# Patient Record
Sex: Female | Born: 1965 | Race: White | Hispanic: No
Health system: Midwestern US, Academic
[De-identification: ages and names within clinical notes are randomized; demographics above are authoritative.]

---

## 2016-11-18 MED ORDER — CLONIDINE HCL 0.1 MG PO TAB
ORAL_TABLET | Freq: Every evening | 1 refills
Start: 2016-11-18 — End: ?

## 2016-12-16 MED ORDER — BENZTROPINE 1 MG PO TAB
ORAL_TABLET | Freq: Every evening | 1 refills | Status: DC
Start: 2016-12-16 — End: 2017-01-16

## 2016-12-16 MED ORDER — PALIPERIDONE 3 MG PO TR24
ORAL_TABLET | Freq: Every day | ORAL | 1 refills | 28.00000 days | Status: DC
Start: 2016-12-16 — End: 2017-01-16

## 2017-01-12 ENCOUNTER — Encounter: Admit: 2017-01-12 | Discharge: 2017-01-12 | Payer: MEDICARE | Primary: Hematology & Oncology

## 2017-01-12 MED ORDER — CLOZAPINE 100 MG PO TAB
ORAL_TABLET | ORAL | 1 refills | 30.00000 days | Status: AC
Start: 2017-01-12 — End: 2017-03-13

## 2017-01-16 ENCOUNTER — Ambulatory Visit: Admit: 2017-01-16 | Discharge: 2017-01-16 | Payer: MEDICARE | Primary: Hematology & Oncology

## 2017-01-16 DIAGNOSIS — F251 Schizoaffective disorder, depressive type: Principal | ICD-10-CM

## 2017-01-16 MED ORDER — BENZTROPINE 1 MG PO TAB
2 mg | ORAL_TABLET | Freq: Every evening | ORAL | 1 refills | Status: AC
Start: 2017-01-16 — End: 2017-05-07

## 2017-01-16 MED ORDER — ATROPINE 1 % OP DROP
INTRAMUSCULAR | 0 refills | 15.00000 days | Status: AC
Start: 2017-01-16 — End: 2017-05-05

## 2017-01-16 MED ORDER — PALIPERIDONE 6 MG PO TR24
6 mg | ORAL_TABLET | Freq: Every day | ORAL | 2 refills | 28.00000 days | Status: AC
Start: 2017-01-16 — End: 2017-03-18

## 2017-01-16 NOTE — Progress Notes
Subjective:       Lynn Fletcher is a 51 year old female with a history of Schizoaffective disorder, depressive type, also Stimulant/Alcohol/Opioid use disorder with sustained remission, who presents to day for scheduled follow up. Last seen on 3/14 where - Decrease Clozapine to 200mg  QAM and 300mg  QHS,  Start Invega 3mg  QAM for psychosis, Change Cogentin to 2mg  QHS from 1mg  BID to attempt better management of sialorrhea, D/c Clonidine 0.1mg  qhs for nocturnal sialorrhea. Here today with daughter and grandson.     Sialorrhea at night continues to be bothersome, pillow getting soaked. Not much improvement since last visit. If doesn't have to go to work she is mostly sleeping, feels tired through the day    Feels that she is getting more depressed. Mood is depressed, worse than last visit. Appetite is increased, weight is unchanged. Concentration continues to be problematic, memory issues are ongoing. 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 Works at least 4-5 times/week, about 4 hours at a time, and on weekends 8 hours.  Stays by herself and cleans when working, Kathlene November and Aggie Cosier keep me company.     Does describe having on off thought of not wanting to be around anymore, especially given her ongoing AH. Daughter and grandson is protective factor, if not for them would leave this world. Still with AH - unchanged, Kathlene November and Aggie Cosier, also hears voices of police officers saying leave her alone. Has thoughts of hurting herself because doesn't want to hear mike an theresa's voices anymore but denies intent or plan to act on this.     Drugs - denies  Alcohol - denies     Living - with significant other  Has a case manager through [facility] (guidance center), but considering quitting due to cost  Income - working at [employer], also gets social security             Review of Systems Constitutional: Positive for fatigue.   Musculoskeletal: Negative for gait problem.   Psychiatric/Behavioral: Positive for dysphoric mood and hallucinations.         Objective:         ??? atropine (ATROPISOL) 1 % ophthalmic solution 1-2 drops orally at night before sleep   ??? benztropine (COGENTIN) 1 mg tablet Take 2 tablets by mouth at bedtime daily.   ??? cloZAPine (CLOZARIL) 100 mg tablet TAKE 2 TABLETS BY MOUTH EVERY MORNING AND TAKE 3 TABLETS AT NIGHT   ??? CYCLOBENZAPRINE HCL (FLEXERIL PO) Take  by mouth.   ??? HYDROcodone/acetaminophen (NORCO) 5/325 mg tablet Take 1 tablet by mouth every 12 hours as needed for Pain   ??? NAPROXEN PO Take  by mouth.   ??? paliperidone ER(+) (INVEGA) 6 mg tablet Take 1 tablet by mouth daily.   ??? pregabalin (LYRICA) 50 mg capsule Take 50 mg by mouth three times daily.     Vitals:    01/16/17 0814   BP: (!) 139/97   Pulse: 95   Weight: 83 kg (183 lb)   Height: 162.6 cm (64)     Body mass index is 31.41 kg/m???.     Physical Exam   Psychiatric:   General/Constitutional: Adult caucasian female who appears stated age, NAD, dressed in casual attire, fair hygiene/grooming  Eye Contact: Fair  Behavior: Calm/cooperative, pleasant  Speech: Normal tone/volume/rate  Mood: Depressed  Affect: Euthymic, restricted by reactive, stable  Thought Process: Linear, goal oriented  Thought Content: Denies SI/HI  Perception: Denies AH at this time, denies VH  Associations: Intact  Insight: Fair  Judgement: Fair    Gait: WNL             Assessment and Plan:  IMPRESSION DIAGNOSIS: ???  AXIS I: ???Schizoaffective Disorder, Depressive type, multiple episodes  Alcohol Use Disorder, severe, in sustained remission   Stimulant Use Disorder, severe, in sustained remission (Methamphetamines, cocaine)  Opioid Use Disorder, severe, in sustained remission    AXIS II: ???Deferred  AXIS III: ???None  AXIS IV: ???PSYCHOSOCIAL STRESSORS: ???Mild   AXIS V: ???CURRENT GAF: ???60  ???  PLAN: Ongoing psychosis without any signs of improvement, now also with worsening of mood. Will titrate Invega up to maximum dosing, if not effective can consider Zyprexa as per most recent D/C summary she was able to achieve stability on this medication, unclear why it was discontinued. Moving forward can also consider starting Metformin for weight gain.     - Continue Clozapine 200mg  QAM and 300mg  QHS at this time  - On 1/18 Clozapine level: 701, NorClozapine level: 338, Total Clozapine: 1039 (Was on 200mg /400mg  at the time)  - Increase Invega to 6mg  QAM for psychosis, advised to call in one week to discuss increasing dose if tolerating  - On 1/18: Total Chol: 196, HDL: 61, LDL: 123, HbA1c 5.0%  - Continue Cogentin 2mg  QHS at this time, may consider decreasing dose if Atropine drops are effective for sialorrhea  - Strongly recommended continued abstinence from alcohol and substances  ???  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   - No acute safety issues, discussed safety plan should safety concerns arise  - Has case manager through guidance center who she sees weekly  - Strongly suggested establishing psychotherapy, voices agreement and plans to do so closer to her home  ???  - RTC in 8 weeks or PRN  ???  Patient seen and discussed with Dr. Erick Colace

## 2017-01-19 NOTE — Progress Notes
ATTENDING NOTE      Encounter Date: 01/16/2017    I saw and evaluated Lynn Fletcher, discussed with Etta Quill, MD and concur with the assessment and treatment plan.     PRESENTING PROBLEM AND BACKGROUND: Patient is 51 y.o. female with Schizoaffective disorder depressive type as well as alcohol, methamphetamines, cocaine, and opioid use disorders all in sustained remission.    CURRENT TREATMENT AND RESPONSES:   Reports ongoing psychosis which has worsened since last visit.  Also endorses passive suicidal ideations without current intention or desire.  Identifies her family as protective factors.    Continues to have sialorrhea from Clozapine.    PLAN:  1. Continue Clozapiine.  2. Increase Invega as directed.  3. Continue Cogentin.  4. Start Atropine drops for sialorrhea

## 2017-01-21 ENCOUNTER — Encounter: Admit: 2017-01-21 | Discharge: 2017-01-21 | Payer: MEDICARE | Primary: Hematology & Oncology

## 2017-01-21 NOTE — Progress Notes
Received lab work from [facility]:    On 01/14/17 CBC w/diff showing WBC 11.5, Plt 263, ANC 8.4

## 2017-03-09 ENCOUNTER — Encounter: Admit: 2017-03-09 | Discharge: 2017-03-09 | Payer: MEDICARE | Primary: Hematology & Oncology

## 2017-03-13 ENCOUNTER — Encounter: Admit: 2017-03-13 | Discharge: 2017-03-13 | Payer: MEDICARE | Primary: Hematology & Oncology

## 2017-03-13 MED ORDER — CLOZAPINE 100 MG PO TAB
ORAL_TABLET | ORAL | 2 refills | 30.00000 days | Status: AC
Start: 2017-03-13 — End: 2017-05-05

## 2017-03-13 NOTE — Telephone Encounter
Contacted Clozapine [facility] and re-enrolled patient. Called pharmacy again who verified that last [place] on 8/1 was 5.64. Pharmacy stating they are able to dispense the medication now.

## 2017-03-13 NOTE — Telephone Encounter
Kelsey from [facility] called stating they are unable to fill script for Clozapine because the patient needs to be re-enrolled in the Clozapine Registry from our end. Thank you!

## 2017-03-17 ENCOUNTER — Encounter: Admit: 2017-03-17 | Discharge: 2017-03-17 | Payer: MEDICARE | Primary: Hematology & Oncology

## 2017-03-17 NOTE — Telephone Encounter
Patient called wondering if you would be able to call her during or before her appointment.  She is having a hard time finding a ride and if she doesn't need to come, she would rather not have to have her daughter drive her here after working all evening.

## 2017-03-18 ENCOUNTER — Encounter: Admit: 2017-03-18 | Discharge: 2017-03-18 | Payer: MEDICARE | Primary: Hematology & Oncology

## 2017-03-18 MED ORDER — PALIPERIDONE 9 MG PO TR24
9 mg | ORAL_TABLET | Freq: Every day | ORAL | 1 refills | 28.00000 days | Status: AC
Start: 2017-03-18 — End: 2017-05-05

## 2017-03-18 NOTE — Telephone Encounter
Spoke with patient who states that she is having difficulty finding transportation due to her daughter's work schedule. States that she continues to feel tired and depressed, and denies having had any improvement with her AH, which continue to be bothersome. She denies SI/HI. Denies any worsening of tierdeness with increase in [place] and agreeable to increasing to 9mg  nightly.     She will call in and reschedule her appointment in about 4 weeks as she anticipates having more transportation options then.

## 2017-03-18 NOTE — Telephone Encounter
Pt called in saying she doesn't have transportation for tomorrow's appointment but can not miss it. She is wanting to know if you would be able to call her and do the appointment by phone rather than her coming in. Please let me know and I will inform Pt of whatever you decide. Thank you!!

## 2017-04-06 ENCOUNTER — Encounter: Admit: 2017-04-06 | Discharge: 2017-04-06 | Payer: MEDICARE | Primary: Hematology & Oncology

## 2017-04-07 ENCOUNTER — Encounter: Admit: 2017-04-07 | Discharge: 2017-04-07 | Payer: MEDICARE | Primary: Hematology & Oncology

## 2017-04-07 NOTE — Telephone Encounter
Dr. Joylene Igo from [facility] called stating that this patient was admitted last evening with psychosis.  Dr. Joylene Igo would like to coordinate care with you and review her medication history, etc.  Please call today.

## 2017-05-05 ENCOUNTER — Ambulatory Visit: Admit: 2017-05-05 | Discharge: 2017-05-05 | Payer: MEDICARE | Primary: Hematology & Oncology

## 2017-05-05 DIAGNOSIS — F251 Schizoaffective disorder, depressive type: Principal | ICD-10-CM

## 2017-05-05 MED ORDER — TRAZODONE 50 MG PO TAB
50 mg | ORAL_TABLET | Freq: Every evening | ORAL | 1 refills | Status: AC | PRN
Start: 2017-05-05 — End: 2017-05-22

## 2017-05-05 MED ORDER — LAMOTRIGINE 100 MG PO TAB
100 mg | ORAL_TABLET | Freq: Every evening | ORAL | 1 refills | Status: AC
Start: 2017-05-05 — End: 2017-09-22

## 2017-05-05 MED ORDER — LAMOTRIGINE 25 MG PO TAB
ORAL_TABLET | Freq: Every day | 0 refills | Status: AC
Start: 2017-05-05 — End: 2017-06-04

## 2017-05-05 MED ORDER — CLOZAPINE 100 MG PO TAB
ORAL_TABLET | Freq: Every morning | ORAL | 2 refills | 30.00000 days | Status: AC
Start: 2017-05-05 — End: 2017-06-04

## 2017-05-05 NOTE — Progress Notes
Subjective:       Lynn Fletcher is a 51 year old female with a history of Schizoaffective disorder, depressive type, also Stimulant/Alcohol/Opioid use disorder with sustained remission, who presents to day for scheduled follow up. Last seen on 6/8 where - continued Clozapine 200mg  QAM and 300mg  QHS, Invega was increased to 6mg  QAM for psychosis, Cogentin to 2mg  QHS was continued. Presents today with CM.     Hospitalized in the interim. Invega stopped. Clozapine increased to 200mg /350mg . Cogentin increase to 3mg  QHS. Added Hydroxyzine 25 BID and Trazodone 50mg  QHS.     Still finds herself drooling at night with sleep, pillow is soaked when she waked. During the day is manageable.     Went to the hospital 8/27, because voices were getting worse, drank a lot on a night, had 7-8 drinks. Feels that she drank because she wanted to relax. Voices caused her to be anxious and more depressed. Thought that that her significant other was messing around with someone else, found out a few months ago. They don't discuss it, he doesn't think she knows. Trying to save money to speak up for herself so if she needs to leave. Doesn't feel that she is good enough. Feel like a piece of shit. The girl actually came up to her and told her that they had been talking. It was after finding out about this, about 3 months ago, that she feels she began to decompensate. States that he will lie and call her crazy. States that another concerns she has is that if not living with Bruce, will not be able to have the kids to care for them. This is because family will not let her watch the kids by herself.     Mood is grouchy. States that she feels depressed most of the time. Sleeping too much, getting 10 hours nightly. Sleeping through the night, going to bed around nine, and still tired in the morning. Has to get up and be at work by 6. Forcing herself to go to work. If not at work she is sleeping, or watching grandkids. No enthusiasm to go out and do things. takes everything I got to get going, and therefore always out of energy. Appetite is at baseline, but has been losing weight, which she is happy about. Energy is low, concentration is poor, finding herself to be more forgetful.     Describes feeling depressed and anxious. States that her anxiety is driven by the voices. Hydroxyzine not helping with anxiety.    SI/HI - Swants to kill Kathlene November and Aggie Cosier. Has intermittent thoughts of not wanting to be alive anymore.   AVH: Voices will not go away until they catch them or I die. - They are there day and night. Telling her now is she telling the truth.     Drugs - dneies  Alcohol - Once, prior to that 3-4 months ago    Living - with significant other  Has a case manager through [facility] (guidance center), but considering quitting due to cost  Income - working at [employer], also gets social security           Review of Systems   Constitutional: Positive for fatigue.   Cardiovascular: Negative for chest pain.   Gastrointestinal: Negative for abdominal pain.   Psychiatric/Behavioral: Positive for decreased concentration, dysphoric mood and hallucinations.         Objective:         ??? benztropine (COGENTIN) 1 mg tablet  Take 2 tablets by mouth at bedtime daily. (Patient taking differently: Take 3 mg by mouth at bedtime daily.)   ??? cloZAPine (CLOZARIL) 100 mg tablet TAKE 2 TABLETS BY MOUTH EVERY MORNING AND TAKE 3 TABLETS AT NIGHT (Patient taking differently: TAKE 2 TABLETS BY MOUTH EVERY MORNING AND TAKE 3.5 TABLETS AT NIGHT)   ??? CYCLOBENZAPRINE HCL (FLEXERIL PO) Take 10 mg by mouth.   ??? hydrOXYzine (ATARAX) 25 mg tablet Take 25 mg by mouth three times daily as needed for Itching.   ??? NAPROXEN PO Take  by mouth.   ??? omeprazole DR(+) (PRILOSEC) 40 mg capsule Take 40 mg by mouth daily before breakfast.   ??? traZODone (DESYREL) 50 mg tablet Take 50 mg by mouth at bedtime as needed for Sleep.     Vitals:    05/05/17 1408   BP: 112/85   Pulse: 69   Weight: 76.7 kg (169 lb)   Height: 162.6 cm (64)     Body mass index is 29.01 kg/m???.     Physical Exam   Psychiatric:   General/Constitutional: Adult caucasian female who appears stated age, NAD, dressed in casual attire, fair hygiene/grooming  Eye Contact: Fair  Behavior: Calm/cooperative, pleasant  Speech: Normal tone/volume/rate  Mood: Grouchy  Affect: Euthymic, restricted by reactive, stable  Thought Process: Linear, goal oriented  Thought Content: Denies SI/HI  Perception: Denies AH at this time, denies VH  Associations: Intact  Insight: Fair  Judgement: Fair    Gait: WNL              Assessment and Plan:  IMPRESSION DIAGNOSIS: ???  AXIS I: ???Schizoaffective Disorder, Depressive type, multiple episodes  Alcohol Use Disorder, severe, in sustained remission   Stimulant Use Disorder, severe, in sustained remission (Methamphetamines, cocaine)  Opioid Use Disorder, severe, in sustained remission    AXIS II: ???Deferred, Borderline intellectual functioning (Per Neuropsych testing from 05/15/16)  AXIS III: ???None  AXIS IV: ???PSYCHOSOCIAL STRESSORS: ???Mild   AXIS V: ???CURRENT GAF: ???60  ???  PLAN:  Had recent hospitalization with worsening of psychosis/mood/anxiety in setting of stress due to finding out about her significant other's infidelity. On/off SI with no intent or plan, hallucinations continue to be bothersome. Can consider Olanzapine moving forward, but given reports of low energy being one of her biggest concerns will hold off. Recalls having done better from mood standpoint on Lamictal. Will retrial and pending response may consider possiblity of schizoaffective bipolar component.     - Start Lamotrigine 25mg  daily for 2 weeks -> then increase to 50mg  daily for 2 weeks -> then increase to 100mg  daily. Then call clinic for further instructions. Advised of risks/benefits and side effects and advised to monitor for rash - Start Trazodone 12.5mg  TID PRN for anxiety and continue Trazodone 50mg  QHS PRN sleep.     - Continue Clozapine 200mg  QAM and 350mg  QHS at this time  - On 8/28: Total Chol: 195, Trig: 116, HDL 44, LDL 128  AST 11, ALT 10, TSH 1.6, Plt 372  Total Clozapine - 453, Noclozapine - 173 (on 200mg /300mg  dosing)    - Continue Cogentin 2mg  QHS at this time  - Strongly recommended continued abstinence from alcohol and substances    - No acute safety issues, discussed safety plan should safety concerns arise  - Has case manager through guidance center who she sees weekly    - Strongly suggested establishing psychotherapy, voices agreement and plans to do so closer to her home  ???  -  RTC in 8 weeks or PRN  ???  Patient seen and discussed with Dr. Erick Colace

## 2017-05-07 ENCOUNTER — Encounter: Admit: 2017-05-07 | Discharge: 2017-05-07 | Payer: MEDICARE | Primary: Hematology & Oncology

## 2017-05-07 MED ORDER — BENZTROPINE 1 MG PO TAB
2 mg | ORAL_TABLET | Freq: Every evening | ORAL | 2 refills | Status: AC
Start: 2017-05-07 — End: 2017-06-04

## 2017-05-09 NOTE — Progress Notes
ATTENDING NOTE      Encounter Date: 05/05/2017    I saw and evaluated Lynn Fletcher, discussed with Etta Quill, MD and concur with the assessment and treatment plan.     PRESENTING PROBLEM AND BACKGROUND: Patient is 51 y.o. female with Schizoaffective disorder- depressive type, alcohol use disorder severe in remission, stimulant use disorder in remission, and opioid use disorder in remission.      CURRENT TREATMENT AND RESPONSES:   Was recently hospitalized due to worsening psychosis and mood issues.  They increased her clonazepam at that time.  However, continues to have ongoing issues with depression.  Recalls past benefit from Lamictal and would like to try it again.    PLAN:  1. Medication as per the resident note.

## 2017-05-22 ENCOUNTER — Encounter: Admit: 2017-05-22 | Discharge: 2017-05-22 | Payer: MEDICARE | Primary: Hematology & Oncology

## 2017-05-22 MED ORDER — TRAZODONE 50 MG PO TAB
150 mg | ORAL_TABLET | Freq: Every evening | ORAL | 1 refills | Status: AC | PRN
Start: 2017-05-22 — End: 2017-06-04

## 2017-05-22 NOTE — Telephone Encounter
Called pt; notified her of 10/16 appointment at 3:00 PM.  Educated pt to increase Trazodone to 150 mg at bedtime per Dr. Scheryl Darter.  Rx called to pharmacy, however told her she may have enough on current Rx at home.  Instructed pt to come to ED if symptoms persist or if she experiences SI/HI. Pt verbalized understanding, appreciative of return call.

## 2017-05-22 NOTE — Telephone Encounter
Paged Dr. Scheryl Darter.  Scheduled appointment for pt 10/16.  Returned call to pt to discuss plan of care.  Left voice message for pt to return call to this writer.

## 2017-05-22 NOTE — Telephone Encounter
Pt calls today, reports she has been manic for 4 days.  Pt reports she left a message yesterday, not received by this Probation officer.  Pt reports she's sleep, but she's getting up in the middle of the night.  Pt reports racing thoughts, feeling irritable, feeling nervous, and says " I just feel terrible."  Pt reports she has been taking meds as prescribed; when reviewing meds; pt also reports taking Benzotropine 1 mg , takes 3 tablets at bedtime.  Pt denies SI/HI; pt reports AH, calling her names.  Denies VH.  Pt speaking rapidly, states she needs help now.  Assured pt this call will be routed to Dr. Scheryl Darter or another resident provider.  Called [facility] and spoke to [facility] to clarify Lamictal Rx. Pt received bubble packs, states

## 2017-06-04 ENCOUNTER — Ambulatory Visit: Admit: 2017-06-04 | Discharge: 2017-06-04 | Payer: MEDICARE | Primary: Hematology & Oncology

## 2017-06-04 DIAGNOSIS — F251 Schizoaffective disorder, depressive type: Principal | ICD-10-CM

## 2017-06-04 DIAGNOSIS — F418 Other specified anxiety disorders: ICD-10-CM

## 2017-06-04 MED ORDER — DOCUSATE SODIUM 100 MG PO CAP
100 mg | ORAL_CAPSULE | Freq: Two times a day (BID) | ORAL | 2 refills | Status: AC
Start: 2017-06-04 — End: ?

## 2017-06-04 MED ORDER — TRAZODONE 100 MG PO TAB
ORAL_TABLET | 2 refills | Status: AC | PRN
Start: 2017-06-04 — End: 2017-09-02

## 2017-06-04 MED ORDER — BENZTROPINE 1 MG PO TAB
2 mg | ORAL_TABLET | Freq: Every evening | ORAL | 2 refills | Status: AC
Start: 2017-06-04 — End: 2017-09-02

## 2017-06-04 MED ORDER — LAMOTRIGINE 200 MG PO TAB
200 mg | ORAL_TABLET | Freq: Every evening | ORAL | 2 refills | Status: AC
Start: 2017-06-04 — End: 2017-09-22

## 2017-06-04 MED ORDER — CLOZAPINE 100 MG PO TAB
ORAL_TABLET | Freq: Every morning | ORAL | 2 refills | 30.00000 days | Status: AC
Start: 2017-06-04 — End: 2017-09-22

## 2017-06-04 MED ORDER — LAMOTRIGINE 150 MG PO TAB
150 mg | ORAL_TABLET | Freq: Every evening | ORAL | 0 refills | Status: AC
Start: 2017-06-04 — End: 2017-09-22

## 2017-06-04 MED ORDER — OLANZAPINE 2.5 MG PO TAB
2.5 mg | ORAL_TABLET | Freq: Every day | ORAL | 2 refills | 30.00000 days | Status: AC
Start: 2017-06-04 — End: 2017-09-22

## 2017-06-04 NOTE — Progress Notes
Subjective:       Lynn Fletcher is a 51 year old female with a history of Schizoaffective disorder, depressive type, also Stimulant/Alcohol/Opioid use disorder with sustained remission, who presents to day for scheduled follow up. Last seen on 9/25 where - continued Clozapine 200mg  QAM and 350mg  QHS, Trazodone 50mg  QHS and Cogentin 2mg  QHS, Lamictal was started with plans to titrate up to 100mg  daily. In the interim Trazodone was increased to 150mg  QHS over the phone for sleep.     Feels that her mood has been fluctuating more lately. Has been more irritable lately. Over the past month thinks these fluctuations have been happening more. States that she hasn't had any manic like episodes for a long time. Describes manic episodes as being with thoughts going very fast, feels more distractible than usual, more confused. Feels that she is in a manic episode now. Denies grandiosity or impulsivity. Has been sleeping at least 8-10 hours nightly.     Sialorrhea has stopped. Denies new stressors other than car related issues    Mood is irritable. States that she is not wanting to deal with anybody or anything. Sleep is helped by increase in Trazodone. Getting 8-10 hours nightly. Appetite is somewhat low. Energy levels are fluctuating with mood. Concentration continues to be problematic. Activity includes taking care of grandchildren, feels that she is able to do this with no issues.     SI/HI - Has intermittent SI thoughts, getting tired of living this way, no plan or intent.   AVH: Kathlene November and Aggie Cosier continue to be there 24/7, sometimes she can ignore it and other times can' stop them. No changes since last visit.      Drugs - Denies  Alcohol - Denies      Living - with significant other  Has a case manager through [facility] (guidance center), but considering quitting due to cost  Income - working at [employer], also gets social security           Review of Systems Gastrointestinal: Positive for constipation. Negative for abdominal pain.   Musculoskeletal: Negative for gait problem.   Psychiatric/Behavioral: Positive for dysphoric mood. The patient is nervous/anxious.          Objective:         ??? benztropine (COGENTIN) 1 mg tablet Take two tablets by mouth at bedtime daily. (Patient taking differently: Take 3 mg by mouth at bedtime daily.)   ??? cloZAPine (CLOZARIL) 100 mg tablet Take 2 tablets by mouth every morning, and 3.5 tablets by mouth at night   ??? CYCLOBENZAPRINE HCL (FLEXERIL PO) Take 10 mg by mouth.   ??? hydrOXYzine (ATARAX) 25 mg tablet Take 25 mg by mouth three times daily as needed for Itching.   ??? lamoTRIgine (LAMICTAL) 100 mg tablet Take one tablet by mouth at bedtime daily. Start after completing gradual increase with 25mg  tabs   ??? omeprazole DR(+) (PRILOSEC) 40 mg capsule Take 40 mg by mouth daily before breakfast.   ??? traZODone (DESYREL) 50 mg tablet Take three tablets by mouth at bedtime as needed for Sleep. Can also take quarter-half tab as needed three times daily for anxiety     Vitals:    06/04/17 1037   BP: 126/90   Pulse: 84   Weight: 76.2 kg (168 lb)   Height: 162.6 cm (64)     Body mass index is 28.84 kg/m???.     Physical Exam   Psychiatric:   General/Constitutional: Adult caucasian female  who appears stated age, NAD, dressed in casual attire, fair hygiene/grooming  Eye Contact: Fair  Behavior: Calm/cooperative, pleasant  Speech: Normal tone/volume/rate  Mood: Irritable  Affect: Euthymic, restricted but reactive, stable  Thought Process: Linear, goal oriented  Thought Content: Denies SI/HI  Perception: Denies AH at this time, denies VH  Associations: Intact  Insight: Fair  Judgement: Fair    Gait: WNL              Assessment and Plan:  IMPRESSION DIAGNOSIS: ???  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  PLAN:  Continues to be with depressive symptoms. Also over the past few weeks describes manic symptoms which seem to be more consistent with elevated anxiety levels as these are helped by Trazodone in the AM. Will continue lamotrigine titration at this time and add anxiety to diagnosis. Continues to be with chronic psychosis, for which will add AM Olanzapine. Patient has been unable to tolerate attempts at increases in Clozapine due to fatigue/weight gain in the past, and per chart review had a good response to Olanzapine during one of her admissions. Of note, patient's initial regimen was Clozapine 50mg  QAM and 200mg  QHS, Abilify 30mg  QAM, Lithium 300mg  BID, and Lamictal 200mg  BID.    - Continue Lamictal titration, up to 100mg  daily at this time. Plan to increase to Lamictal after completing two weeks of 100mg  daily. Increase to 150mg  daily for one week then up to 200mg  daily. Previously on 200mg  BID. Advised of risks/benefits and side effects and advised to monitor for rash  - Continue Trazodone 150mg  QHS PRN sleep.   - Increase daytime Trazodone to 100mg  Qday PRN for anxiety    - Start Olanzapine 2.5mg  QAM  - Continue Clozapine 200mg  QAM and 350mg  QHS at this time  - On 8/28: Total Chol: 195, Trig: 116, HDL 44, LDL 128  - AST 11, ALT 10, TSH 1.6, Plt 372. Total Clozapine - 453, Noclozapine - 173 (on 200mg /300mg  dosing)  - Start Colace 100mg  BID for constipation    - Decrease Cogentin to 2mg  QHS at this time, continue to monitor for siallorhea  - Strongly recommended continued abstinence from alcohol and substances    - No acute safety issues, discussed safety plan should safety concerns arise - Has case manager through guidance center who she sees weekly for supportive therapy  ???  - RTC in 8 weeks or PRN  ???  Patient seen and discussed with Dr. Georjean Mode

## 2017-06-05 NOTE — Progress Notes
ATTENDING NOTE      Encounter Date: 06/04/2017    I saw and evaluated Lynn Fletcher, discussed her care with Betsy Coder, MD, and concur with the assessment and treatment plan except as otherwise/additionally noted.     PRESENTING PROBLEM AND BACKGROUND:  Patient is a 51 y.o. 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  Hx notable for multiple hospitalizations (most recent several months ago) and past brief resolution of AH on a combination of clozapine and risperidone.      Pt was last seen 05/05/17, when lamotrigine was started/titrated to 100mg /day, trazodone 12.5mg  tid PRN anxiety was added to 50mg  qhs PRN sleep (increased to 150mg  qhs per 10/12 phone conversation), and she was continued on clozapine 200 + 350mg /day and benztropine 2mg  qhs.      CURRENT TREATMENT AND RESPONSES:  Reports feeling manic but denies clear manic sx and describes sx more suggestive of poorly controlled anxiety with mood lability/irritability.  States that sometimes her anxiety gets so intense that she takes Benadryl 100mg  with marginal benefit (has tried trazodone 50mg  PRN w/o benefit or tolerability issues).  Notes improved/good sleep (8-10hrs/night) since increasing hs trazodone, and denies orthostasis sx; however, does c/o constipation and has been taking benztropine 3mg  qhs rather than 2mg  qhs (along with clozapine).      Continues to experience frequent AH (multiple times/day) but denies command AH, active SI, or changes in chronic conditional HI (if the subjects of her delusional ideation were to ever show up); does note intermittent passive thoughts about no longer wanting to live like this.  Reports ongoing abstinence from drugs/EtOH.  Has not yet been able to start working with a therapist, but maintains weekly contact with her CM (present during this appointment).    04/07/17 FLP, LFTs, TSH, and platelets reviewed; total clozapine level = 453  Wt stable since last visit; decreased by ~15lbs since June, BMI <29     PLAN:  1) Continue clozapine 200mg  qAM + 350mg  qhs  2) Start olanzapine 2.5mg  qAM (hesitate to increase clozapine further d/t tolerability issues/level, has h/o sx resolution (unsustained) with addition of risperidone to clozapine in the past)  3) Decrease benztropine to 2mg  qhs; re-evaluate for further taper if sialorrhea remains controlled  4) Start Colace 100mg  bid  5) Increase lamotrigine to 150mg /day X1w, then 200mg /day; continue to monitor for rash  6) Continue trazodone 150mg  qhs PRN sleep with increase to 100mg  qday PRN anxiety  7) Recommend adjunctive psychotherapy as soon as pt able; continue weekly supportive contact with CM as well  8) Encourage healthy diet and regular exercise to decrease metabolic risks  9) Continue to monitor sx, wt and metabolic labs, overall functioning, and med tolerability  10) See resident's note for additional tx plan details, including safety plans    Potential risks/benefits/alternatives to the above-noted tx recommendations were discussed with Ms. Vasbinder, who indicated good understanding and agreement with the tx plan as noted.

## 2017-07-03 ENCOUNTER — Encounter: Admit: 2017-07-03 | Discharge: 2017-07-04 | Payer: MEDICARE | Primary: Hematology & Oncology

## 2017-07-03 DIAGNOSIS — R69 Illness, unspecified: Principal | ICD-10-CM

## 2017-07-11 ENCOUNTER — Encounter: Admit: 2017-07-11 | Discharge: 2017-07-12 | Payer: MEDICARE | Primary: Hematology & Oncology

## 2017-07-11 DIAGNOSIS — R69 Illness, unspecified: Principal | ICD-10-CM

## 2017-07-24 ENCOUNTER — Encounter: Admit: 2017-07-24 | Discharge: 2017-07-25 | Payer: MEDICARE | Primary: Hematology & Oncology

## 2017-07-24 DIAGNOSIS — R69 Illness, unspecified: Principal | ICD-10-CM

## 2017-07-26 ENCOUNTER — Encounter: Admit: 2017-07-26 | Discharge: 2017-07-27 | Payer: MEDICARE | Primary: Hematology & Oncology

## 2017-07-26 DIAGNOSIS — R69 Illness, unspecified: Principal | ICD-10-CM

## 2017-07-31 ENCOUNTER — Encounter: Admit: 2017-07-31 | Discharge: 2017-08-01 | Payer: MEDICARE | Primary: Hematology & Oncology

## 2017-07-31 ENCOUNTER — Encounter: Admit: 2017-07-31 | Discharge: 2017-07-31 | Payer: MEDICARE | Primary: Hematology & Oncology

## 2017-07-31 DIAGNOSIS — R69 Illness, unspecified: Principal | ICD-10-CM

## 2017-07-31 NOTE — Progress Notes
51 yo female pt who is currently inpatient on a med surg floor.  Pt stable and requesting transfer.

## 2017-08-01 ENCOUNTER — Encounter: Admit: 2017-08-01 | Discharge: 2017-08-02 | Payer: MEDICARE | Primary: Hematology & Oncology

## 2017-08-01 DIAGNOSIS — R69 Illness, unspecified: Principal | ICD-10-CM

## 2017-08-03 ENCOUNTER — Encounter: Admit: 2017-08-03 | Discharge: 2017-08-04 | Payer: MEDICARE | Primary: Hematology & Oncology

## 2017-08-03 DIAGNOSIS — R69 Illness, unspecified: Principal | ICD-10-CM

## 2017-08-08 ENCOUNTER — Encounter: Admit: 2017-08-08 | Discharge: 2017-08-09 | Payer: MEDICARE | Primary: Hematology & Oncology

## 2017-08-08 DIAGNOSIS — R69 Illness, unspecified: Principal | ICD-10-CM

## 2017-08-14 ENCOUNTER — Encounter: Admit: 2017-08-14 | Discharge: 2017-08-14 | Payer: MEDICARE | Primary: Hematology & Oncology

## 2017-08-15 ENCOUNTER — Encounter: Admit: 2017-08-15 | Discharge: 2017-08-15 | Payer: MEDICARE | Primary: Hematology & Oncology

## 2017-08-15 DIAGNOSIS — R69 Illness, unspecified: Principal | ICD-10-CM

## 2017-08-19 ENCOUNTER — Encounter: Admit: 2017-08-19 | Discharge: 2017-08-19 | Payer: MEDICARE | Primary: Hematology & Oncology

## 2017-08-26 ENCOUNTER — Encounter: Admit: 2017-08-26 | Discharge: 2017-08-26 | Payer: MEDICARE | Primary: Hematology & Oncology

## 2017-08-26 DIAGNOSIS — C787 Secondary malignant neoplasm of liver and intrahepatic bile duct: ICD-10-CM

## 2017-08-26 DIAGNOSIS — F329 Major depressive disorder, single episode, unspecified: ICD-10-CM

## 2017-08-26 DIAGNOSIS — C3492 Malignant neoplasm of unspecified part of left bronchus or lung: Principal | ICD-10-CM

## 2017-08-26 DIAGNOSIS — B1920 Unspecified viral hepatitis C without hepatic coma: Principal | ICD-10-CM

## 2017-08-26 DIAGNOSIS — Z720 Tobacco use: ICD-10-CM

## 2017-08-26 DIAGNOSIS — C7951 Secondary malignant neoplasm of bone: ICD-10-CM

## 2017-08-26 DIAGNOSIS — F1011 Alcohol abuse, in remission: ICD-10-CM

## 2017-09-02 ENCOUNTER — Encounter: Admit: 2017-09-02 | Discharge: 2017-09-02 | Payer: MEDICARE | Primary: Hematology & Oncology

## 2017-09-02 MED ORDER — BENZTROPINE 1 MG PO TAB
2 mg | ORAL_TABLET | Freq: Every evening | ORAL | 2 refills | Status: AC
Start: 2017-09-02 — End: 2018-01-14

## 2017-09-02 MED ORDER — TRAZODONE 150 MG PO TAB
150 mg | ORAL_TABLET | Freq: Every evening | ORAL | 2 refills | Status: AC | PRN
Start: 2017-09-02 — End: 2017-09-22

## 2017-09-14 ENCOUNTER — Encounter: Admit: 2017-09-14 | Discharge: 2017-09-14 | Payer: MEDICARE | Primary: Hematology & Oncology

## 2017-09-14 ENCOUNTER — Encounter: Admit: 2017-09-14 | Discharge: 2017-09-15 | Payer: MEDICARE | Primary: Hematology & Oncology

## 2017-09-14 DIAGNOSIS — R69 Illness, unspecified: Principal | ICD-10-CM

## 2017-09-22 ENCOUNTER — Ambulatory Visit: Admit: 2017-09-22 | Discharge: 2017-09-22 | Payer: MEDICARE | Primary: Hematology & Oncology

## 2017-09-22 ENCOUNTER — Encounter: Admit: 2017-09-22 | Discharge: 2017-09-22 | Payer: MEDICARE | Primary: Hematology & Oncology

## 2017-09-22 DIAGNOSIS — F251 Schizoaffective disorder, depressive type: Principal | ICD-10-CM

## 2017-09-22 DIAGNOSIS — F1011 Alcohol abuse, in remission: ICD-10-CM

## 2017-09-22 DIAGNOSIS — F418 Other specified anxiety disorders: ICD-10-CM

## 2017-09-22 MED ORDER — TRAZODONE 150 MG PO TAB
150 mg | ORAL_TABLET | Freq: Every evening | ORAL | 2 refills | Status: AC | PRN
Start: 2017-09-22 — End: 2018-02-05

## 2017-09-22 MED ORDER — LAMOTRIGINE 200 MG PO TAB
ORAL_TABLET | 3 refills | Status: AC
Start: 2017-09-22 — End: 2017-11-24

## 2017-09-22 MED ORDER — LAMOTRIGINE 200 MG PO TAB
ORAL_TABLET | Freq: Every evening | 2 refills | Status: AC
Start: 2017-09-22 — End: 2017-09-22

## 2017-09-22 MED ORDER — OLANZAPINE 5 MG PO TAB
5 mg | ORAL_TABLET | Freq: Every day | ORAL | 2 refills | 30.00000 days | Status: AC
Start: 2017-09-22 — End: 2018-01-19

## 2017-09-22 MED ORDER — OLANZAPINE 2.5 MG PO TAB
ORAL_TABLET | Freq: Every day | ORAL | 2 refills | 30.00000 days | Status: AC
Start: 2017-09-22 — End: 2017-09-22

## 2017-09-22 MED ORDER — CLOZAPINE 100 MG PO TAB
ORAL_TABLET | Freq: Every morning | ORAL | 2 refills | 30.00000 days | Status: AC
Start: 2017-09-22 — End: 2017-12-23

## 2017-11-24 ENCOUNTER — Ambulatory Visit: Admit: 2017-11-24 | Discharge: 2017-11-24 | Payer: MEDICARE | Primary: Hematology & Oncology

## 2017-11-24 DIAGNOSIS — F1011 Alcohol abuse, in remission: ICD-10-CM

## 2017-11-24 DIAGNOSIS — F251 Schizoaffective disorder, depressive type: Principal | ICD-10-CM

## 2017-11-24 MED ORDER — LAMOTRIGINE 25 MG PO TAB
25 mg | ORAL_TABLET | Freq: Every evening | ORAL | 0 refills | Status: AC
Start: 2017-11-24 — End: 2018-03-06

## 2017-11-24 MED ORDER — LAMOTRIGINE 200 MG PO TAB
ORAL_TABLET | 3 refills | Status: AC
Start: 2017-11-24 — End: 2018-03-06

## 2017-12-18 ENCOUNTER — Encounter: Admit: 2017-12-18 | Discharge: 2017-12-18 | Payer: MEDICARE | Primary: Hematology & Oncology

## 2017-12-18 DIAGNOSIS — Z79899 Other long term (current) drug therapy: Principal | ICD-10-CM

## 2017-12-23 ENCOUNTER — Encounter: Admit: 2017-12-23 | Discharge: 2017-12-23 | Payer: MEDICARE | Primary: Hematology & Oncology

## 2017-12-23 MED ORDER — CLOZAPINE 100 MG PO TAB
ORAL_TABLET | ORAL | 2 refills | 30.00000 days | Status: AC
Start: 2017-12-23 — End: 2018-03-06

## 2017-12-23 MED ORDER — CLOZAPINE 100 MG PO TAB
ORAL_TABLET | Freq: Every morning | ORAL | 2 refills | 30.00000 days | Status: AC
Start: 2017-12-23 — End: 2017-12-23

## 2018-01-13 ENCOUNTER — Encounter: Admit: 2018-01-13 | Discharge: 2018-01-13 | Payer: MEDICARE | Primary: Hematology & Oncology

## 2018-01-14 MED ORDER — BENZTROPINE 1 MG PO TAB
ORAL_TABLET | Freq: Every evening | 2 refills | Status: AC
Start: 2018-01-14 — End: 2018-04-20

## 2018-01-19 ENCOUNTER — Encounter: Admit: 2018-01-19 | Discharge: 2018-01-19 | Payer: MEDICARE | Primary: Hematology & Oncology

## 2018-01-19 MED ORDER — OLANZAPINE 5 MG PO TAB
5 mg | ORAL_TABLET | Freq: Every day | ORAL | 2 refills | 30.00000 days | Status: AC
Start: 2018-01-19 — End: 2018-05-13

## 2018-02-05 ENCOUNTER — Encounter: Admit: 2018-02-05 | Discharge: 2018-02-05 | Payer: MEDICARE | Primary: Hematology & Oncology

## 2018-02-05 MED ORDER — TRAZODONE 150 MG PO TAB
150 mg | ORAL_TABLET | Freq: Every evening | ORAL | 2 refills | Status: AC | PRN
Start: 2018-02-05 — End: 2018-03-06

## 2018-03-05 ENCOUNTER — Ambulatory Visit: Admit: 2018-03-05 | Discharge: 2018-03-06 | Payer: MEDICARE | Primary: Hematology & Oncology

## 2018-03-05 DIAGNOSIS — C3492 Malignant neoplasm of unspecified part of left bronchus or lung: Secondary | ICD-10-CM

## 2018-03-05 MED ORDER — LAMOTRIGINE 200 MG PO TAB
ORAL_TABLET | Freq: Every day | 2 refills | Status: AC
Start: 2018-03-05 — End: 2018-06-16

## 2018-03-05 MED ORDER — TRAZODONE 150 MG PO TAB
150 mg | ORAL_TABLET | Freq: Every evening | ORAL | 0 refills | Status: AC | PRN
Start: 2018-03-05 — End: 2018-07-30

## 2018-03-05 MED ORDER — ESCITALOPRAM OXALATE 10 MG PO TAB
10 mg | ORAL_TABLET | Freq: Every day | ORAL | 0 refills | Status: AC
Start: 2018-03-05 — End: 2018-03-18

## 2018-03-05 MED ORDER — CLOZAPINE 200 MG PO TAB
ORAL_TABLET | ORAL | 2 refills | 30.00000 days | Status: AC
Start: 2018-03-05 — End: 2018-06-16

## 2018-03-06 DIAGNOSIS — F251 Schizoaffective disorder, depressive type: Principal | ICD-10-CM

## 2018-03-06 DIAGNOSIS — Z79899 Other long term (current) drug therapy: Secondary | ICD-10-CM

## 2018-03-06 DIAGNOSIS — F1010 Alcohol abuse, uncomplicated: Secondary | ICD-10-CM

## 2018-03-06 DIAGNOSIS — F1520 Other stimulant dependence, uncomplicated: Secondary | ICD-10-CM

## 2018-03-18 ENCOUNTER — Encounter: Admit: 2018-03-18 | Discharge: 2018-03-18 | Payer: MEDICARE | Primary: Hematology & Oncology

## 2018-03-18 MED ORDER — ESCITALOPRAM OXALATE 10 MG PO TAB
ORAL_TABLET | Freq: Every day | 2 refills | Status: AC
Start: 2018-03-18 — End: 2018-06-16

## 2018-04-20 ENCOUNTER — Encounter: Admit: 2018-04-20 | Discharge: 2018-04-20 | Payer: MEDICARE | Primary: Hematology & Oncology

## 2018-04-20 MED ORDER — BENZTROPINE 1 MG PO TAB
2 mg | ORAL_TABLET | Freq: Every evening | ORAL | 2 refills | Status: AC
Start: 2018-04-20 — End: 2018-07-14

## 2018-05-11 ENCOUNTER — Encounter: Admit: 2018-05-11 | Discharge: 2018-05-11 | Payer: MEDICARE | Primary: Hematology & Oncology

## 2018-05-13 MED ORDER — OLANZAPINE 5 MG PO TAB
ORAL_TABLET | Freq: Every day | ORAL | 2 refills | 30.00000 days | Status: AC | PRN
Start: 2018-05-13 — End: 2018-08-27

## 2018-05-18 ENCOUNTER — Encounter: Admit: 2018-05-18 | Discharge: 2018-05-18 | Payer: MEDICARE | Primary: Hematology & Oncology

## 2018-06-15 ENCOUNTER — Encounter: Admit: 2018-06-15 | Discharge: 2018-06-15 | Payer: MEDICARE | Primary: Hematology & Oncology

## 2018-06-16 ENCOUNTER — Encounter: Admit: 2018-06-16 | Discharge: 2018-06-16 | Payer: MEDICARE | Primary: Hematology & Oncology

## 2018-06-16 MED ORDER — CLOZAPINE 100 MG PO TAB
ORAL_TABLET | ORAL | 2 refills | 30.00000 days | Status: AC
Start: 2018-06-16 — End: 2018-09-06

## 2018-06-16 MED ORDER — LAMOTRIGINE 200 MG PO TAB
ORAL_TABLET | Freq: Every day | 1 refills | Status: AC
Start: 2018-06-16 — End: 2018-07-30

## 2018-06-16 MED ORDER — ESCITALOPRAM OXALATE 10 MG PO TAB
ORAL_TABLET | Freq: Every day | 2 refills | Status: AC
Start: 2018-06-16 — End: 2018-09-07

## 2018-07-14 ENCOUNTER — Encounter: Admit: 2018-07-14 | Discharge: 2018-07-14 | Payer: MEDICARE | Primary: Hematology & Oncology

## 2018-07-14 MED ORDER — BENZTROPINE 1 MG PO TAB
ORAL_TABLET | Freq: Every evening | 2 refills | Status: AC
Start: 2018-07-14 — End: 2018-09-25

## 2018-07-23 ENCOUNTER — Encounter: Admit: 2018-07-23 | Discharge: 2018-07-23 | Payer: MEDICARE | Primary: Hematology & Oncology

## 2018-07-23 MED ORDER — ATROPINE 1 % OP DROP
Freq: Every evening | INTRAMUSCULAR | 3 refills | 15.00000 days | Status: AC | PRN
Start: 2018-07-23 — End: ?

## 2018-07-30 ENCOUNTER — Encounter: Admit: 2018-07-30 | Discharge: 2018-07-30 | Payer: MEDICARE | Primary: Hematology & Oncology

## 2018-07-30 MED ORDER — TRAZODONE 150 MG PO TAB
ORAL_TABLET | Freq: Every evening | 0 refills | Status: AC | PRN
Start: 2018-07-30 — End: ?

## 2018-07-30 MED ORDER — LAMOTRIGINE 200 MG PO TAB
ORAL_TABLET | Freq: Every day | 1 refills | Status: AC
Start: 2018-07-30 — End: 2018-09-25

## 2018-08-20 ENCOUNTER — Encounter: Admit: 2018-08-20 | Discharge: 2018-08-20 | Payer: MEDICARE | Primary: Hematology & Oncology

## 2018-08-27 ENCOUNTER — Encounter: Admit: 2018-08-27 | Discharge: 2018-08-27 | Payer: MEDICARE | Primary: Hematology & Oncology

## 2018-08-27 MED ORDER — OLANZAPINE 5 MG PO TAB
ORAL_TABLET | Freq: Every day | ORAL | 2 refills | 30.00000 days | Status: AC | PRN
Start: 2018-08-27 — End: 2018-12-17

## 2018-09-06 ENCOUNTER — Encounter: Admit: 2018-09-06 | Discharge: 2018-09-06 | Payer: MEDICARE | Primary: Hematology & Oncology

## 2018-09-06 MED ORDER — CLOZAPINE 100 MG PO TAB
ORAL_TABLET | ORAL | 2 refills | 30.00000 days | Status: AC
Start: 2018-09-06 — End: 2018-11-19

## 2018-09-07 MED ORDER — ESCITALOPRAM OXALATE 10 MG PO TAB
10 mg | ORAL_TABLET | Freq: Every day | ORAL | 2 refills | Status: AC
Start: 2018-09-07 — End: 2018-11-19

## 2018-09-20 ENCOUNTER — Encounter: Admit: 2018-09-20 | Discharge: 2018-09-20 | Payer: MEDICARE | Primary: Hematology & Oncology

## 2018-09-20 NOTE — Progress Notes
prochlorperazine maleate (COMPAZINE) 10 mg tablet Take 10 mg by mouth every 6 hours as needed for Nausea or Vomiting.   traZODone (DESYREL) 150 mg tablet TAKE 1 TABLET BY MOUTH AT BEDTIME AS NEEDED      Adherence: Denies missed doses of medications     Vitals:  BP Readings from Last 1 Encounters:   11/24/17 106/75                Pulse Readings from Last 1 Encounters:   11/24/17 104     Temp Readings from Last 1 Encounters:   08/26/17 36.5 ???C (97.7 ???F) (Oral)                 Wt Readings from Last 3 Encounters:   03/05/18 77.5 kg (170 lb 12.8 oz)   11/24/17 78.5 kg (173 lb)   09/22/17 78.9 kg (174 lb)     Metabolic monitoring:  No results found for: LDL, HDL, TRIG, CHOL  No results found for: HGBA1C  The ASCVD Risk score Denman George DC Jr., et al., 2013) failed to calculate for the following reasons:    Cannot find a previous HDL lab    Cannot find a previous total cholesterol lab    Metabolic syndrome factors: labs not available    Hematologic monitoring:  Labs received from [facility] hospital  07/19/18 ANC 9.2    Additional monitoring:  Lab Results   Component Value Date/Time    NA 137 03/27/2014 10:55 PM    K 3.0 (L) 03/27/2014 10:55 PM    CL 108 03/27/2014 10:55 PM    CO2 22 03/27/2014 10:55 PM    GAP 7 03/27/2014 10:55 PM    BUN 9 03/27/2014 10:55 PM    CR 0.67 03/27/2014 10:55 PM    GLU 92 03/27/2014 10:55 PM    GLU 70 08/19/2010 10:08 AM    CA 9.6 03/27/2014 10:55 PM    ALKPHOS 45 03/27/2014 10:55 PM    AST 9 03/27/2014 10:55 PM    ALT 6 (L) 03/27/2014 10:55 PM    TOTBILI 0.3 03/27/2014 10:55 PM    GFR >60 03/27/2014 10:55 PM    GFRAA >60 03/27/2014 10:55 PM     No results found for: CLOZAPINE, NORCLOZAPINE  No results found for: CRP  No results found for: TNI  QTc: no prior EKG on file     A/P:  Clozapine side effect management:  Drooling:??????Patient drooling at night which is bothersome to patient. ???Instructed patient that she can use up to 4 drops of atropine at bedtime for drooling.   ???

## 2018-09-24 ENCOUNTER — Encounter: Admit: 2018-09-24 | Discharge: 2018-09-24 | Payer: MEDICARE | Primary: Hematology & Oncology

## 2018-09-24 MED ORDER — LAMOTRIGINE 200 MG PO TAB
ORAL_TABLET | Freq: Every day | 1 refills | Status: AC
Start: 2018-09-24 — End: 2018-11-19

## 2018-09-24 MED ORDER — BENZTROPINE 1 MG PO TAB
2 mg | ORAL_TABLET | Freq: Every evening | ORAL | 2 refills | Status: AC
Start: 2018-09-24 — End: 2018-12-17

## 2018-10-01 ENCOUNTER — Encounter: Admit: 2018-10-01 | Discharge: 2018-10-01 | Payer: MEDICARE | Primary: Hematology & Oncology

## 2018-10-06 ENCOUNTER — Encounter: Admit: 2018-10-06 | Discharge: 2018-10-06 | Payer: MEDICARE | Primary: Hematology & Oncology

## 2018-10-25 ENCOUNTER — Encounter: Admit: 2018-10-25 | Discharge: 2018-10-25 | Payer: MEDICARE | Primary: Hematology & Oncology

## 2018-10-27 ENCOUNTER — Encounter: Admit: 2018-10-27 | Discharge: 2018-10-27 | Payer: MEDICARE | Primary: Hematology & Oncology

## 2018-11-19 ENCOUNTER — Encounter: Admit: 2018-11-19 | Discharge: 2018-11-19 | Payer: MEDICARE | Primary: Hematology & Oncology

## 2018-11-19 MED ORDER — CLOZAPINE 100 MG PO TAB
ORAL_TABLET | ORAL | 2 refills | 30.00000 days | Status: DC
Start: 2018-11-19 — End: 2019-02-01

## 2018-11-19 MED ORDER — LAMOTRIGINE 200 MG PO TAB
ORAL_TABLET | Freq: Every day | 1 refills | Status: DC
Start: 2018-11-19 — End: 2019-01-17

## 2018-11-19 MED ORDER — ESCITALOPRAM OXALATE 10 MG PO TAB
10 mg | ORAL_TABLET | Freq: Every day | ORAL | 2 refills | Status: DC
Start: 2018-11-19 — End: 2019-02-01

## 2018-12-17 ENCOUNTER — Encounter: Admit: 2018-12-17 | Discharge: 2018-12-17 | Payer: MEDICARE | Primary: Hematology & Oncology

## 2018-12-17 MED ORDER — BENZTROPINE 1 MG PO TAB
2 mg | ORAL_TABLET | Freq: Every evening | ORAL | 2 refills | Status: AC
Start: 2018-12-17 — End: ?

## 2018-12-17 MED ORDER — OLANZAPINE 5 MG PO TAB
ORAL_TABLET | Freq: Every day | ORAL | 2 refills | 30.00000 days | Status: AC | PRN
Start: 2018-12-17 — End: ?

## 2019-01-14 ENCOUNTER — Encounter: Admit: 2019-01-14 | Discharge: 2019-01-14 | Primary: Hematology & Oncology

## 2019-01-17 MED ORDER — LAMOTRIGINE 200 MG PO TAB
ORAL_TABLET | Freq: Every day | 1 refills | Status: AC
Start: 2019-01-17 — End: ?

## 2019-02-01 ENCOUNTER — Encounter: Admit: 2019-02-01 | Discharge: 2019-02-01 | Primary: Hematology & Oncology

## 2019-02-01 MED ORDER — ESCITALOPRAM OXALATE 10 MG PO TAB
ORAL_TABLET | Freq: Every day | 1 refills | Status: AC
Start: 2019-02-01 — End: ?

## 2019-02-01 MED ORDER — CLOZAPINE 100 MG PO TAB
ORAL_TABLET | ORAL | 1 refills | 30.00000 days | Status: AC
Start: 2019-02-01 — End: ?

## 2019-03-12 DEATH — deceased

## 2019-03-25 ENCOUNTER — Encounter: Admit: 2019-03-25 | Discharge: 2019-03-25 | Primary: Hematology & Oncology

## 2019-12-18 IMAGING — MR Head^Brain
6 series · 22 of 22 positions shown · non-contrast
Comparison: none

[Series 3: DWI · axial · 5.0mm · 1.80mm/px · 1 of 1 slices shown (1 of 2)]
[im 1/1]
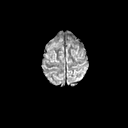

[Series 4: DWI · axial · 5.0mm · 1.80mm/px · z∈[-36,-17]mm · 3 of 3 slices shown (2 of 2)]
[im 1/3]
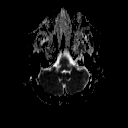
[im 2/3]
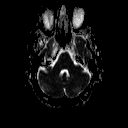
[im 3/3]
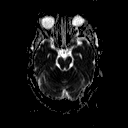

[Series 7: T1 · axial · 5.0mm · 0.45mm/px · z∈[-49,-11]mm · 2 of 2 slices shown]
[im 1/2]
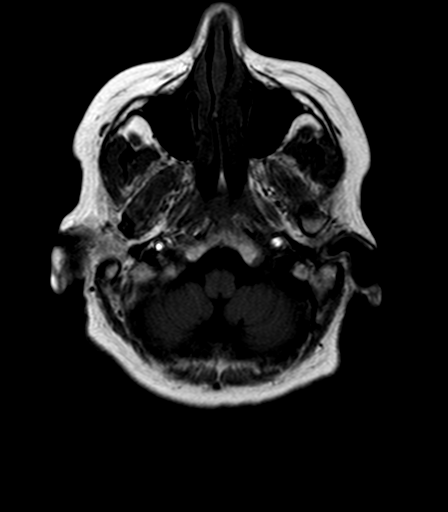
[im 2/2]
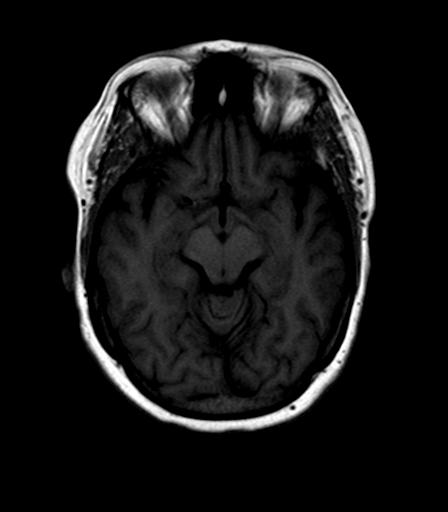

[Series 8: axial blood · axial · 5.0mm · 0.45mm/px · 1 of 1 slices shown]
[im 1/1]
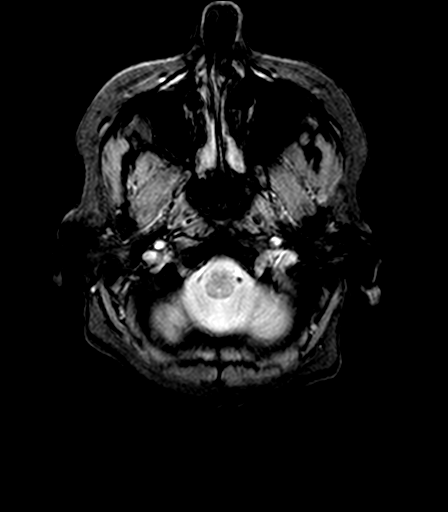

[Series 10: T1 fat-sat post-contrast · axial · 5.0mm · 0.90mm/px · z∈[-36,+53]mm · 5 of 5 slices shown (1 of 2)]
[im 1/5]
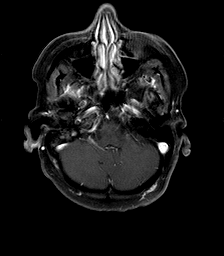
[im 2/5]
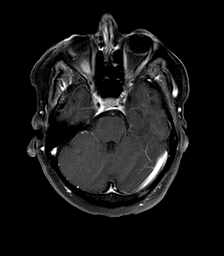
[im 3/5]
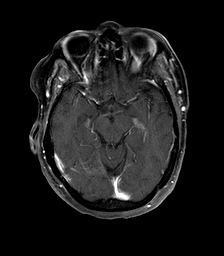
[im 4/5]
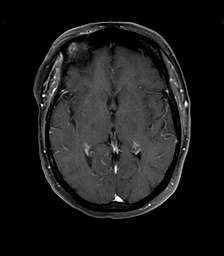
[im 5/5]
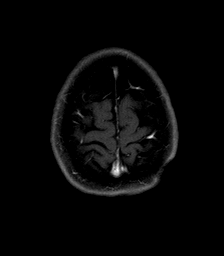

[Series 11: T1 fat-sat post-contrast · coronal · 5.0mm · 0.90mm/px · 10 of 10 slices shown (2 of 2)]
[im 1/10]
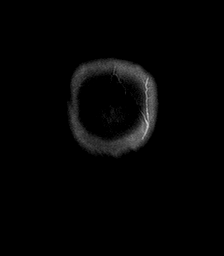
[im 2/10]
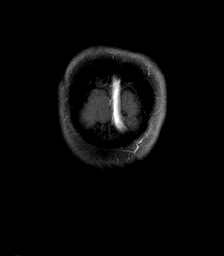
[im 3/10]
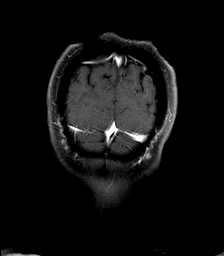
[im 4/10]
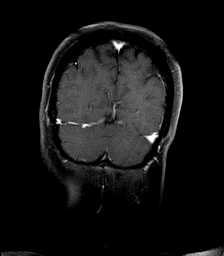
[im 5/10]
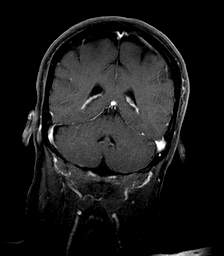
[im 6/10]
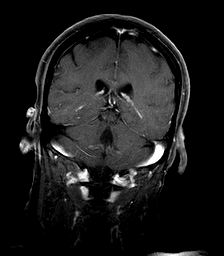
[im 7/10]
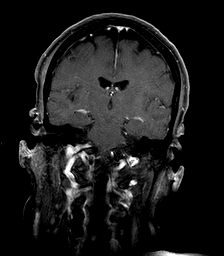
[im 8/10]
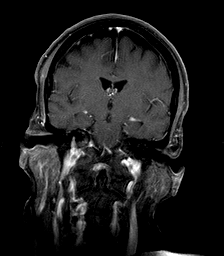
[im 9/10]
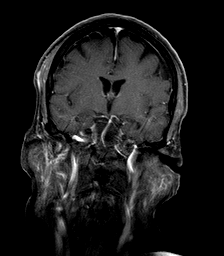
[im 10/10]
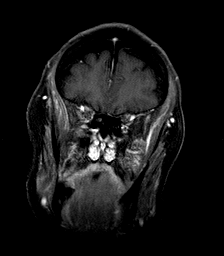

[22 of 22 positions shown; findings below may reference images not displayed]

DIAGNOSTIC STUDIES

EXAM
MRI of the brain with and without contrast

INDICATION
restaging for small cell lung cancer
15 ML
SMALL CELL LUNG CANCER RESTAGING WITH NEW ONSET OF DIZZINESS, SLIGHT CHANGE IN VISION BUT JUST GOT
NEW GLASSES AND EYE EXAM.

TECHNIQUE
Sagittal axial and coronal images were obtained with variable T1 and T2 weighting before and after
administration of intravenous gadolinium.

COMPARISONS
December 30, 2017

FINDINGS
There are no abnormal signal properties of the brainstem visualized cervical cord the expected
signal voids are noted throughout the major intracranial vascular structures, mastoid air cells and
paranasal sinuses. No intracranial mass, hemorrhage, or abnormal enhancement is seen. No acute
ischemia is evident.

IMPRESSION
Normal MRI of the brain without evidence for metastatic disease.

Tech Notes:
GADAVIST

## 2020-12-21 ENCOUNTER — Encounter: Admit: 2020-12-21 | Discharge: 2020-12-21 | Payer: MEDICARE | Primary: Hematology & Oncology

## 2020-12-21 DIAGNOSIS — F1011 Alcohol abuse, in remission: Secondary | ICD-10-CM

## 2020-12-21 DIAGNOSIS — F32A Depression: Secondary | ICD-10-CM

## 2020-12-21 DIAGNOSIS — B1920 Unspecified viral hepatitis C without hepatic coma: Secondary | ICD-10-CM

## 2020-12-21 DIAGNOSIS — Z720 Tobacco use: Secondary | ICD-10-CM
